# Patient Record
Sex: Female | Born: 1985 | Race: Black or African American | Hispanic: No | Marital: Single
Health system: Midwestern US, Community
[De-identification: ages and names within clinical notes are randomized; demographics above are authoritative.]

---

## 2011-08-26 IMAGING — CR RIGHT ANKLE - COMPLETE 3+ VIEW
1 series · 5 of 5 positions shown · non-contrast
Comparison: none

REASON FOR EXAM: painful rt ankle
COMMENTS:

PROCEDURE:     DXR - DXR ANKLE RIGHT COMPLETE  - February 03, 2010 [DATE]
RESULT:     Images of the right ankle demonstrate changes of previous ORIF
with lateral metallic plate along the distal fibula with numerous screws
through the plate into the fibula. No acute bony abnormality is evident.

[Series 1: view not recorded · 0.17mm/px · 5 of 5 slices shown]
[im 1/5]
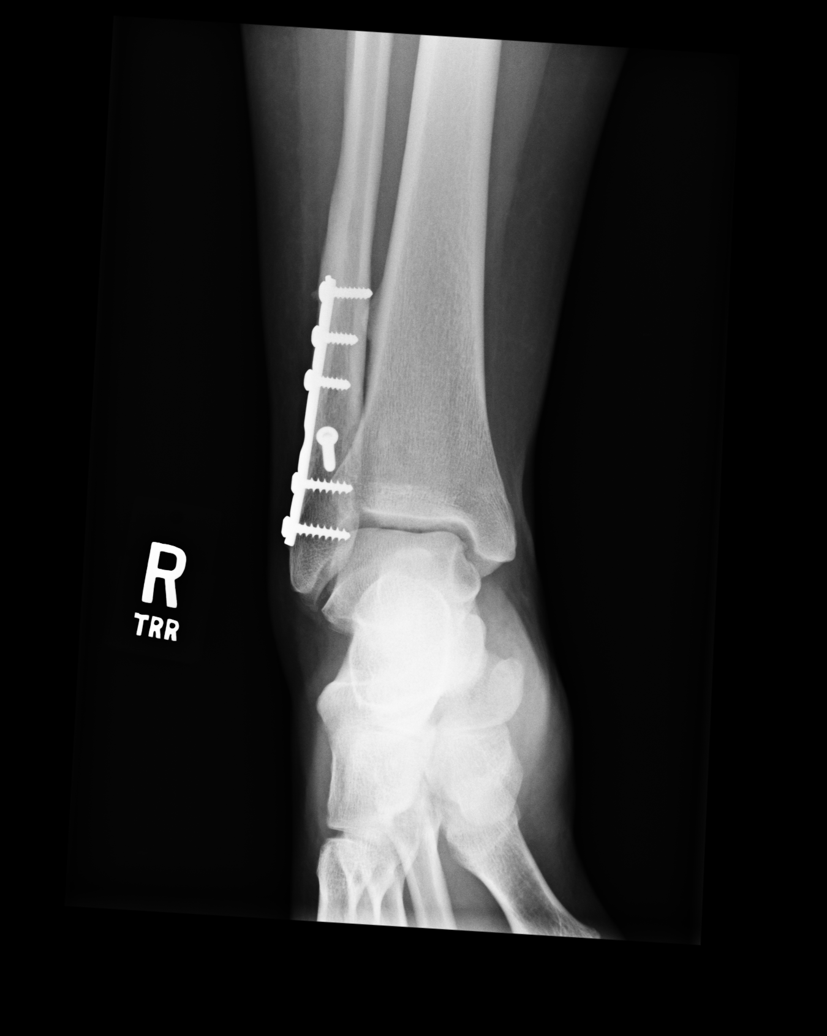
[im 2/5]
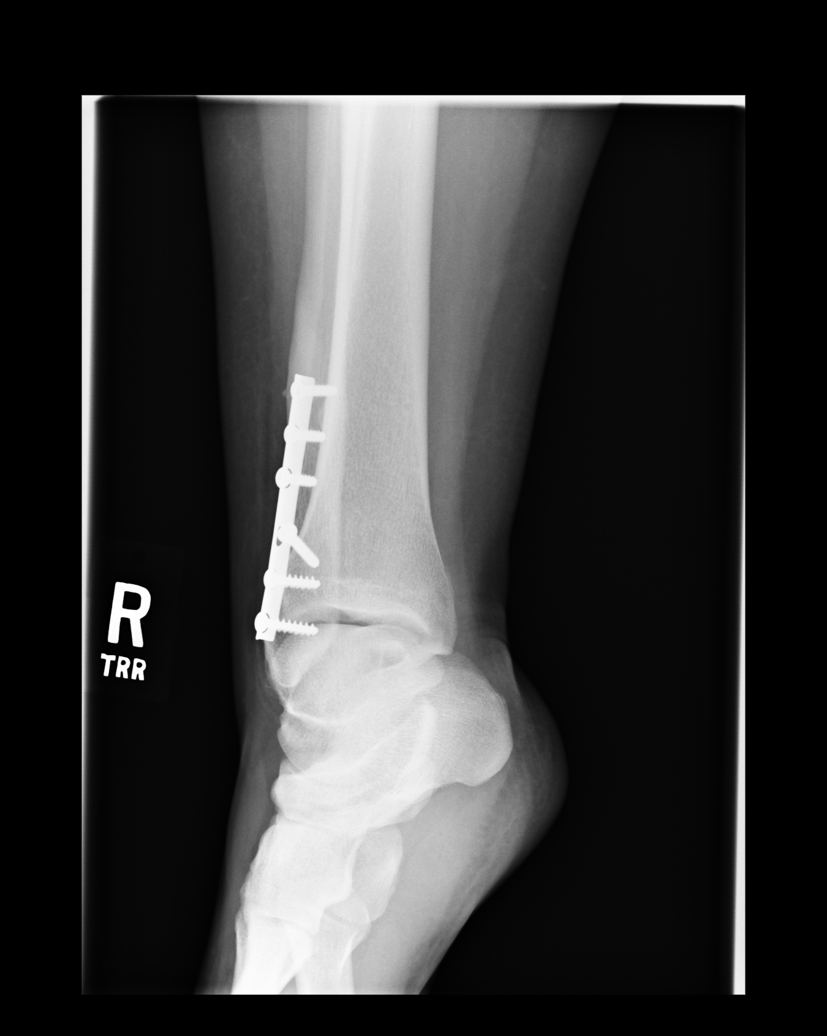
[im 3/5]
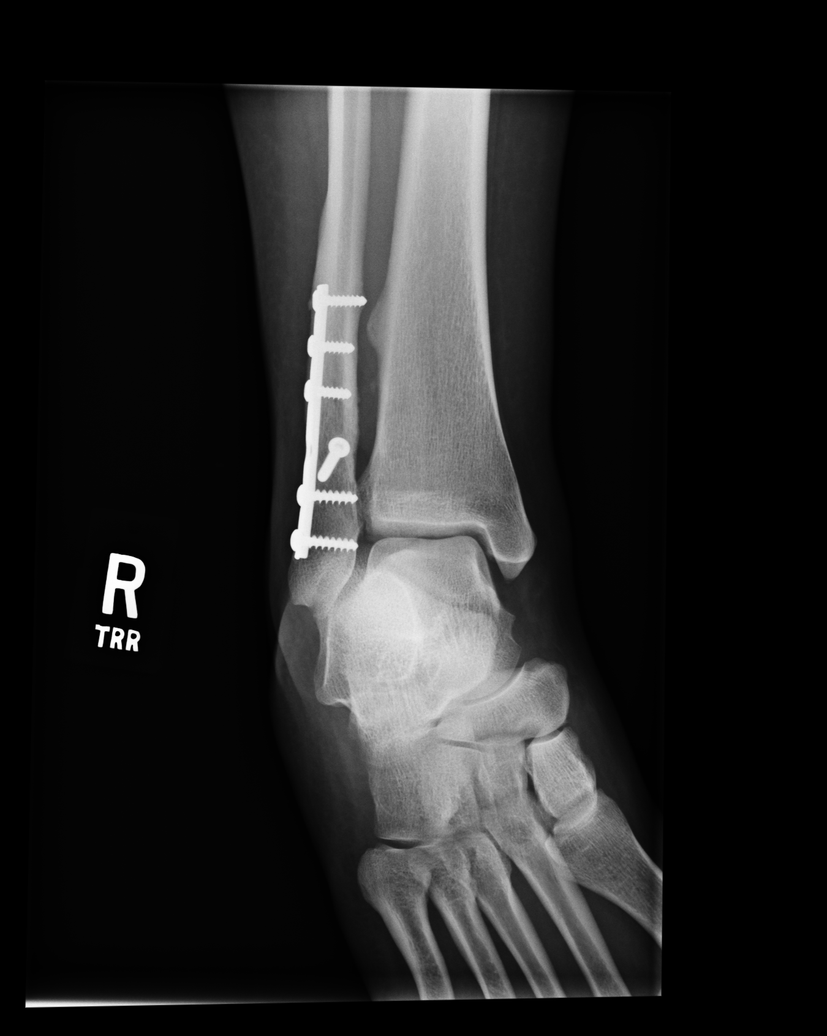
[im 4/5]
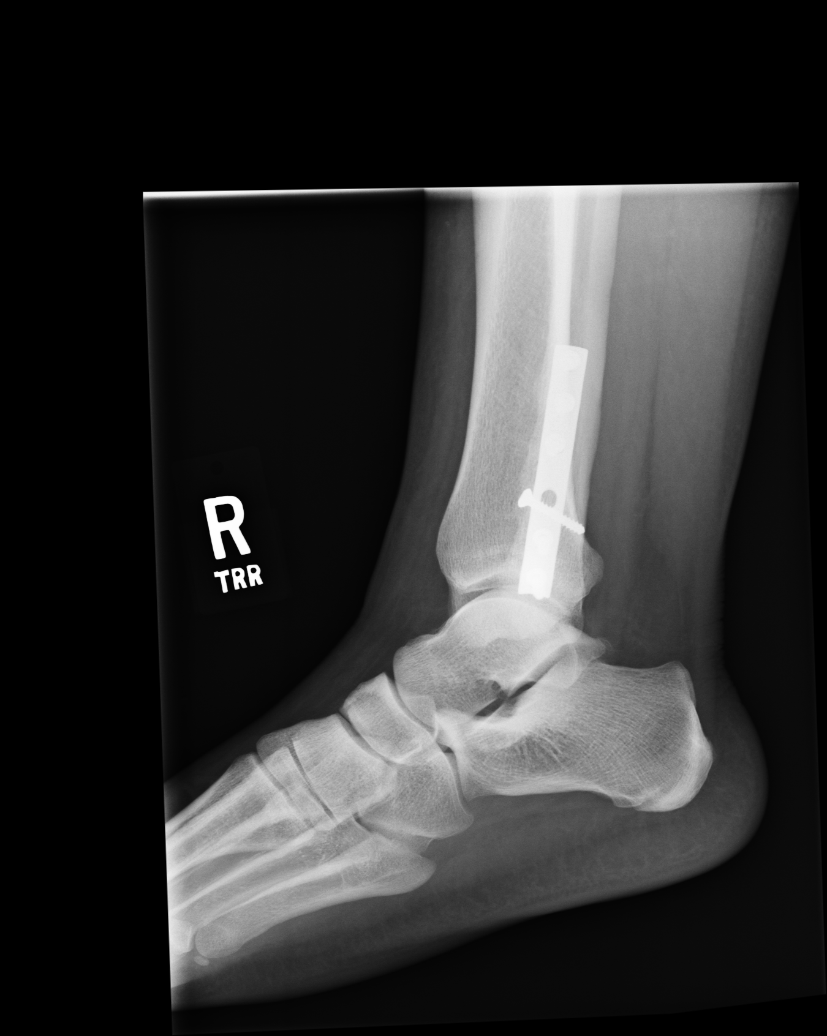
[im 5/5]
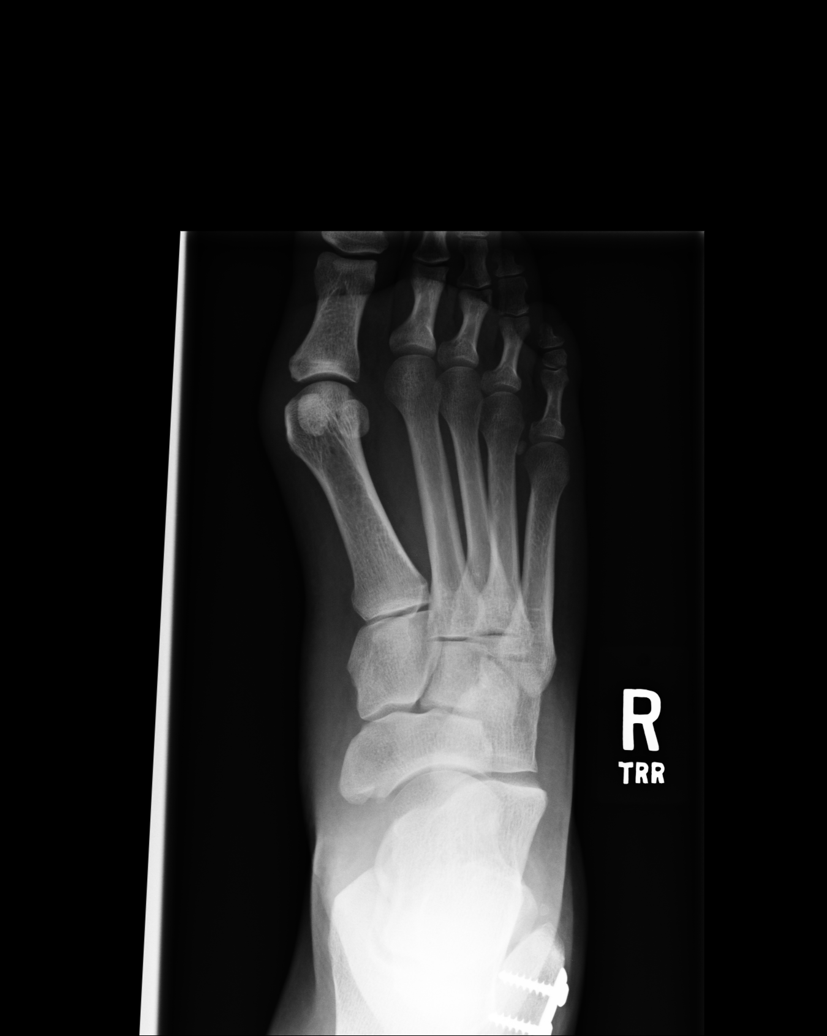

[5 of 5 positions shown; findings below may reference images not displayed]

IMPRESSION: No acute abnormality demonstrated.

## 2015-06-16 ENCOUNTER — Inpatient Hospital Stay
Admit: 2015-06-16 | Discharge: 2015-06-16 | Disposition: A | Discharge status: Home / Self Care | Payer: MEDICARE | Attending: Emergency Medicine

## 2015-06-16 ENCOUNTER — Emergency Department: Admit: 2015-06-16 | Payer: MEDICARE | Primary: Family Medicine

## 2015-06-16 DIAGNOSIS — M5441 Lumbago with sciatica, right side: Secondary | ICD-10-CM

## 2015-06-16 LAB — METABOLIC PANEL, COMPREHENSIVE
A-G Ratio: 1 (ref 1.0–3.1)
ALT (SGPT): 21 U/L (ref 12.0–78.0)
AST (SGOT): 14 U/L — ABNORMAL LOW (ref 15–37)
Albumin: 4 g/dL (ref 3.40–5.00)
Alk. phosphatase: 85 U/L (ref 46–116)
Anion gap: 15 mmol/L (ref 10–17)
BUN/Creatinine ratio: 10 (ref 6.0–20.0)
BUN: 9 MG/DL (ref 7–18)
Bilirubin, total: 0.2 MG/DL (ref 0.20–1.00)
CO2: 27 mmol/L (ref 21–32)
Calcium: 9.7 MG/DL (ref 8.5–10.1)
Chloride: 103 mmol/L (ref 98–107)
Creatinine: 0.87 MG/DL (ref 0.6–1.3)
GFR est AA: >60 mL/min/{1.73_m2} (ref >60)
GFR est non-AA: >60 mL/min/{1.73_m2} (ref >60)
Globulin: 3.9 g/dL
Glucose: 87 mg/dL (ref 74–106)
Potassium: 4.4 mmol/L (ref 3.50–5.10)
Protein, total: 7.9 g/dL (ref 6.40–8.20)
Sodium: 141 mmol/L (ref 136–145)

## 2015-06-16 LAB — URINALYSIS W/ RFLX MICROSCOPIC
Bilirubin: NEGATIVE
Blood: NEGATIVE
Glucose: NEGATIVE mg/dL
Leukocyte Esterase: NEGATIVE
Nitrites: NEGATIVE
Protein: NEGATIVE mg/dL
Specific gravity: 1.025 (ref 1.003–1.030)
Urobilinogen: 0.2 EU/dL (ref 0.2–1.0)
pH (UA): 6.5 (ref 5.0–7.0)

## 2015-06-16 LAB — CBC WITH AUTOMATED DIFF
ABS. BASOPHILS: 0 10*3/uL (ref 0.0–0.2)
ABS. EOSINOPHILS: 0 10*3/uL (ref 0.0–0.7)
ABS. LYMPHOCYTES: 2.5 10*3/uL (ref 1.2–3.4)
ABS. MONOCYTES: 0.3 10*3/uL — ABNORMAL LOW (ref 1.1–3.2)
ABS. NEUTROPHILS: 3 10*3/uL (ref 1.4–6.5)
BASOPHILS: 0 % (ref 0–2)
EOSINOPHILS: 1 % (ref 0–5)
HCT: 41.2 % (ref 34.0–42.2)
HGB: 13.6 g/dL (ref 11.8–14.2)
IMMATURE GRANULOCYTES: 0.2 % (ref 0.0–5.0)
LYMPHOCYTES: 42 % — ABNORMAL HIGH (ref 16–40)
MCH: 29 PG (ref 27–31)
MCHC: 33 g/dL (ref 32–36)
MCV: 87.8 FL (ref 81–99)
MONOCYTES: 6 % (ref 0–12)
MPV: 11.6 FL — ABNORMAL HIGH (ref 7.4–10.4)
NEUTROPHILS: 51 % (ref 40–70)
PLATELET: 314 10*3/uL (ref 140–450)
RBC: 4.69 M/uL (ref 4.0–5.2)
RDW: 13.2 % (ref 11.5–14.5)
WBC: 5.9 10*3/uL (ref 4.8–10.8)

## 2015-06-16 LAB — HCG QL SERUM: HCG, Ql.: NEGATIVE

## 2015-06-16 MED ORDER — LEVETIRACETAM 500 MG TAB
500 mg | ORAL | Status: DC
Start: 2015-06-16 — End: 2015-06-16

## 2015-06-16 MED ORDER — OXYCODONE 5 MG TAB
5 mg | ORAL | Status: AC
Start: 2015-06-16 — End: 2015-06-16
  Administered 2015-06-16: 11:00:00 via ORAL

## 2015-06-16 MED ORDER — LEVETIRACETAM 500 MG TAB
500 mg | Freq: Two times a day (BID) | ORAL | Status: DC
Start: 2015-06-16 — End: 2015-06-16

## 2015-06-16 MED ORDER — LAMOTRIGINE 25 MG TAB
25 mg | Freq: Every day | ORAL | Status: DC
Start: 2015-06-16 — End: 2015-06-16

## 2015-06-16 MED ORDER — LEVETIRACETAM 250 MG TAB
250 mg | ORAL | Status: AC
Start: 2015-06-16 — End: 2015-06-16
  Administered 2015-06-16: 10:00:00 via ORAL

## 2015-06-16 MED ORDER — OXYCODONE 5 MG CAP
5 mg | ORAL_CAPSULE | ORAL | 0 refills | Status: AC | PRN
Start: 2015-06-16 — End: 2015-06-19

## 2015-06-16 MED ORDER — LAMOTRIGINE 25 MG TAB
25 mg | ORAL | Status: DC
Start: 2015-06-16 — End: 2015-06-16

## 2015-06-16 MED ORDER — KETOROLAC TROMETHAMINE 30 MG/ML INJECTION
30 mg/mL (1 mL) | INTRAMUSCULAR | Status: AC
Start: 2015-06-16 — End: 2015-06-16
  Administered 2015-06-16: 10:00:00 via INTRAVENOUS

## 2015-06-16 MED ORDER — IBUPROFEN 400 MG TAB
400 mg | ORAL_TABLET | Freq: Three times a day (TID) | ORAL | 0 refills | Status: AC | PRN
Start: 2015-06-16 — End: 2015-06-21

## 2015-06-16 MED FILL — OXYCODONE 5 MG TAB: 5 mg | ORAL | Qty: 1

## 2015-06-16 MED FILL — LEVETIRACETAM 500 MG TAB: 500 mg | ORAL | Qty: 2

## 2015-06-16 MED FILL — LAMOTRIGINE 25 MG TAB: 25 mg | ORAL | Qty: 1

## 2015-06-16 MED FILL — LEVETIRACETAM 250 MG TAB: 250 mg | ORAL | Qty: 1

## 2015-06-16 MED FILL — KETOROLAC TROMETHAMINE 30 MG/ML INJECTION: 30 mg/mL (1 mL) | INTRAMUSCULAR | Qty: 1

## 2015-06-16 NOTE — ED Triage Notes (Signed)
29 y/o female with reports of slip/trip fall down stairs on Saturday. Reports hitting lower and upper back on last 2 stairs. Denies LOC. Reports c/o back pain since fall with increasing intensity. States today had syncopal episode. Pt states she last remembers startingh down steps. States she then awoke to niece standing over her and yelling her name. Pt states she was incontinent of urine at the time.

## 2015-06-16 NOTE — ED Provider Notes (Signed)
HPI Comments: Pamela Perkins is a 29 y.o. Female with hx of surgery on her spine, non-convulsive seizures, and asthma who presents to the ED with complaint of a syncopal episode PTA. Pt fell down the stairs in her home. She does not remember falling down the steps. She also fell on her back 6 days ago on the icy steps in front of her house. She also complains of worsening back pain and weakness in her legs after falling 6 days ago. She reports that both of her legs tingle "like they are asleep" when she stands on them. She has had tingling and weakness like this before, since her back surgery, but it has "never been this bad before." She was incontinent of urine today when she had a syncopal episode. Pt has a hx of seizures but reports that her episodes are normally similar to "staring off in the distance and falling over." She notes that she gets dizzy when she stands up quickly. Pt is non compliant with her seizure medications x1 week. Pt explains that the back surgery she had was a cyst removed from her back and disc fusion. Her leg pain radiates down her leg and the tingling is exacerbated by weightbearing. There are no alleviating factors.     Patient is a 29 y.o. female presenting with syncope. The history is provided by the patient.   Syncope    This is a new problem. The current episode started 1 to 2 hours ago. The problem has been resolved. She lost consciousness for a period of less than one minute. The problem is associated with normal activity. Associated symptoms include bladder incontinence, back pain and weakness. Pertinent negatives include no chest pain, no palpitations, no fever, no abdominal pain, no nausea, no vomiting and no headaches. She has tried nothing for the symptoms. The treatment provided no relief. Her past medical history is significant for syncope.        Past Medical History:   Diagnosis Date   ??? Asthma    ??? Seizures (HCC)         History reviewed. No pertinent past surgical history.      History reviewed. No pertinent family history.    Social History     Social History   ??? Marital status: SINGLE     Spouse name: N/A   ??? Number of children: N/A   ??? Years of education: N/A     Occupational History   ??? Not on file.     Social History Main Topics   ??? Smoking status: Current Every Day Smoker     Packs/day: 0.25   ??? Smokeless tobacco: Not on file   ??? Alcohol use 1.2 oz/week     2 Glasses of wine per week   ??? Drug use: No   ??? Sexual activity: Not on file     Other Topics Concern   ??? Not on file     Social History Narrative   ??? No narrative on file         ALLERGIES: Dilaudid [hydromorphone (bulk)] and Vicodin [hydrocodone-acetaminophen]    Review of Systems   Constitutional: Negative for activity change, appetite change and fever.   HENT: Negative for ear pain and trouble swallowing.    Eyes: Negative for pain and visual disturbance.   Respiratory: Negative for cough and shortness of breath.    Cardiovascular: Positive for syncope. Negative for chest pain and palpitations.   Gastrointestinal: Negative for abdominal pain, diarrhea, nausea and  vomiting.   Genitourinary: Positive for bladder incontinence. Negative for dysuria, menstrual problem, pelvic pain, vaginal bleeding and vaginal discharge.   Musculoskeletal: Positive for back pain. Negative for neck stiffness.   Skin: Negative for rash.   Neurological: Positive for syncope and weakness. Negative for headaches.   Hematological: Negative for adenopathy. Does not bruise/bleed easily.       Vitals:    06/16/15 0125 06/16/15 0130 06/16/15 0230 06/16/15 0507   BP:   125/71 (!) 120/95   Pulse:   77 78   Resp:   20 20   Temp: 98.2 ??F (36.8 ??C)   98.2 ??F (36.8 ??C)   SpO2:  98% 100% 100%   Weight:       Height:                Physical Exam   Constitutional: She is oriented to person, place, and time. She appears well-developed and well-nourished. She does not appear ill.   HENT:    Head: Normocephalic.   Right Ear: Tympanic membrane normal.   Left Ear: Tympanic membrane normal.   Nose: Nose normal.   Mouth/Throat: Uvula is midline.   Eyes: Conjunctivae and EOM are normal. Pupils are equal, round, and reactive to light.   Neck: Trachea normal and normal range of motion.   Cardiovascular: Normal rate, regular rhythm and normal pulses.    Pulmonary/Chest: Effort normal and breath sounds normal.   Abdominal: Soft. Normal appearance and bowel sounds are normal.   Musculoskeletal: Normal range of motion.   Neurological: She is alert and oriented to person, place, and time. She has normal strength. She displays a negative Romberg sign. GCS eye subscore is 4. GCS verbal subscore is 5. GCS motor subscore is 6.   Romberg negative.  Decreased sensation on right side.  Straight leg is positive on the right.   Skin: Skin is warm, dry and intact.        Psychiatric: She has a normal mood and affect. Her speech is normal.   Nursing note and vitals reviewed.       MDM  Number of Diagnoses or Management Options  Acute right-sided low back pain with right-sided sciatica:   Fall, initial encounter:   History of seizure:   Diagnosis management comments: 29 y.o. female with Hx of seizures of asthma, seizures, and lower back now complains of R lower back pin radiating down her legs as well as concern for syncopal episode. Differential diagnosis includes seizure vs mechanical fall with head trauma vs vasovagal syncope.       Plan:   Draw blood work  CT head  Medicate for seizures   Follow up with neurologist.   XR lumbar spine  Pain medications  UA  Follow up with PCP, neurologist, and the surgeon that did her past back surgeries.    -----  1:35 AM  Documented by Milana Huntsman, acting as a scribe for Dr. Armida Sans, MD    PROVIDER ATTESTATION:  7:25 AM    The entirety of this note, signed by me, accurately reflects all works, treatments, procedures, and medical decision making performed by me,  Armida Sans, MD.            Patient Progress  Patient progress: stable    ED Course     Diagnostic Studies:  XRAY 1:59 AM     Left  Right     Fingers   Hand   Wrist   Forearm   Elbow  Humerus   Shoulder     Toes   Foot   Ankle   Tib/fib   Knee   Femur   Hip   Pelvis    Cervical Spine   Thoracic Spine   Lumbar Spine   Sacrum   Ribs         Viewed by me     Interpreted by me   No fracture/dislocation   Abnormal : see below    Discussed with       Radiologist        Interpretation: No hardware seen, no obvious fracture    CT Scan 4:04 AM   Head  C-spine  Chest    Abd/Pelvis    Other:    Viewed by me    Interpreted by me   No acute disease   Abnormal: see below   Discussed with Radiologist     Interpretation: negative for ICH, infarct, or mass. Osseous structures intact.        Lab Data:  Labs Reviewed   METABOLIC PANEL, COMPREHENSIVE - Abnormal; Notable for the following:        Result Value    AST 14 (*)     All other components within normal limits   CBC WITH AUTOMATED DIFF - Abnormal; Notable for the following:     MPV 11.6 (*)     LYMPHOCYTES 42 (*)     ABS. MONOCYTES 0.3 (*)     All other components within normal limits   URINALYSIS W/ RFLX MICROSCOPIC - Abnormal; Notable for the following:     Ketone TRACE (*)     All other components within normal limits   HCG QL SERUM         ED Course:     4:04 AM   Pending urinalysis. Patient awake aware. Not in acute distress.     Patient UA reviewed. No acute findings. Patient reassured. Was given oral dose of keppra for seizure history. Discussed return to primary care for follow up.       Procedures

## 2015-06-16 NOTE — ED Notes (Signed)
I have reviewed discharge instructions with the patient.  The patient verbalized understanding.

## 2016-03-19 DIAGNOSIS — T8131XA Disruption of external operation (surgical) wound, not elsewhere classified, initial encounter: Secondary | ICD-10-CM

## 2016-03-19 NOTE — ED Provider Notes (Signed)
HPI Comments: Pamela Perkins is a 30 y.o.obese female who presents to the ED with complaint of oozing from surgical wound for the past 10 days. Pt had a laparoscopic GYN procedure, and noted oozing from her wound a few days after. She attempted to call her surgeon unsuccessfully, and then went to an urgent care that provided her with steri strips. Pt was stretching and moving around today when she heard a ripping sound, and noted the dehiscence to be larger, have sudden pain below her umbilicus around the wound, and noted more discharge described as serosanguinous. Pain improves with holding her abdomen in place and staying still, and worsens with moving around. No abdominal pain otherwise, no bleeding from other sites and no other symptoms.       Patient is a 30 y.o. female presenting with wound dehiscence. The history is provided by the patient and the spouse.   Wound Dehiscence    This is a new problem. Episode onset: today. The problem has been gradually improving. Associated with: stretching. The pain is located in the periumbilical region. The patient is experiencing no pain. Pertinent negatives include no fever, no diarrhea, no hematochezia, no melena, no nausea, no vomiting, no constipation, no dysuria, no frequency, no headaches, no chest pain and no back pain. The pain is worsened by certain positions (movement). The pain is relieved by recumbency. laparascopic surgery on9/13/17       Past Medical History:   Diagnosis Date   ??? Asthma    ??? Seizures (HCC)        History reviewed. No pertinent surgical history.      History reviewed. No pertinent family history.    Social History     Social History   ??? Marital status: SINGLE     Spouse name: N/A   ??? Number of children: N/A   ??? Years of education: N/A     Occupational History   ??? Not on file.     Social History Main Topics   ??? Smoking status: Former Smoker     Packs/day: 0.25   ??? Smokeless tobacco: Never Used   ??? Alcohol use 1.2 oz/week      2 Glasses of wine per week      Comment: Occaisional   ??? Drug use: No   ??? Sexual activity: Not on file     Other Topics Concern   ??? Not on file     Social History Narrative         ALLERGIES: Dilaudid [hydromorphone (bulk)] and Vicodin [hydrocodone-acetaminophen]    Review of Systems   Constitutional: Negative.  Negative for diaphoresis and fever.   HENT: Negative.  Negative for rhinorrhea and sore throat.    Respiratory: Negative.  Negative for cough, chest tightness and shortness of breath.    Cardiovascular: Negative.  Negative for chest pain and leg swelling.   Gastrointestinal: Negative.  Negative for abdominal pain, constipation, diarrhea, hematochezia, melena, nausea and vomiting.   Genitourinary: Negative.  Negative for difficulty urinating, dysuria and frequency.   Musculoskeletal: Negative.  Negative for back pain and joint swelling.   Skin: Positive for wound. Negative for color change and rash.   Allergic/Immunologic: Negative.  Negative for food allergies and immunocompromised state.   Neurological: Negative.  Negative for weakness and headaches.   All other systems reviewed and are negative.      Vitals:    03/19/16 2203   Pulse: 95   Resp: 20   Temp: 98.2 ??F (36.8 ??  C)   SpO2: 98%   Weight: 108.9 kg (240 lb)   Height: 5\' 6"  (1.676 m)            Physical Exam   Constitutional: She is oriented to person, place, and time. She appears well-developed and well-nourished. No distress.   Well-appearing   HENT:   Head: Normocephalic and atraumatic.   Mouth/Throat: Oropharynx is clear and moist.   Eyes: Conjunctivae are normal. Right eye exhibits no discharge. Left eye exhibits no discharge.   Neck: Normal range of motion. Neck supple.   Cardiovascular: Normal rate, regular rhythm and normal heart sounds.  Exam reveals no gallop and no friction rub.    No murmur heard.  Pulmonary/Chest: Effort normal and breath sounds normal. No respiratory distress. She has no wheezes. She has no rales. She exhibits no  tenderness.   Abdominal: Soft. She exhibits no distension and no mass. There is no tenderness. There is no rebound and no guarding.       Pendulous abdomen  4 cm wound below umbilicus, gaping with intact fascia. Subcuticular sutures are partially present in lower aspect, with minimal approximation. Minimal serosanguinous discharge. No active bleeding. No surrounding skin changes.    Musculoskeletal: Normal range of motion. She exhibits no edema or tenderness.   No calf tenderness   Neurological: She is alert and oriented to person, place, and time.   Moving all extremities with no focality   Skin: Skin is warm and dry. No rash noted. She is not diaphoretic.   Nursing note and vitals reviewed.       MDM  Number of Diagnoses or Management Options  Wound dehiscence:   Diagnosis management comments: 30 y/o obese female with wound dehiscence, with intact fascia. No signs of infection. Discussed with the patient risks of closure (namely infection) vs secondary healing. Pt verbalized understanding. Xeroform used to dress the wound. Pt advised to wear binder or high waist pants to support abdominal wall. Advised to f/u with surgeon and educated on signs of infection and reasons to return to the ER. Pt DCed.    Patient Progress  Patient progress: stable    ED Course       Procedures

## 2016-03-19 NOTE — ED Triage Notes (Signed)
30 y/o female to Ed with c/o wound dehisence. Pt reports abdominal surgery s/p March 07, 2016. States wound closure with steri strips. States wound with bleeding since surgery. Seen at urgent care and given additional steri strips. States today heard "ripping sound" and noted blood coming from wound.

## 2016-03-20 ENCOUNTER — Inpatient Hospital Stay
Admit: 2016-03-20 | Discharge: 2016-03-20 | Disposition: A | Discharge status: Home / Self Care | Payer: PRIVATE HEALTH INSURANCE | Attending: Emergency Medicine

## 2016-03-20 NOTE — ED Notes (Signed)
Pt not found in room. Doc aware. Doc states that she gave the pt detailed verbal d/c instructions..Marland Kitchen

## 2016-03-20 NOTE — ED Notes (Signed)
Pt left without written d/c instructions.

## 2016-08-20 DIAGNOSIS — N808 Other endometriosis: Secondary | ICD-10-CM

## 2016-08-20 NOTE — ED Provider Notes (Signed)
HPI Comments: Pamela Perkins is a 31 y.o. female with a hx of endometriosis with laparoscopic surgery in Sept 2017 who presents to the ED with complaint of lower abdominal pain x 3 days with associated nausea and non bloody vomiting.  Pain is intermittent, moderate, and non radiating. No modifying or exacerbating  factors for her sx at this time. No fever, chills, cp, sob, diarrhea, dysuria, hematuria, weakness or numbness. She contacted her regular doctor who instructed her to be seen within ED.    Patient is a 31 y.o. female presenting with abdominal pain. The history is provided by the patient.   Abdominal Pain    This is a recurrent problem. The current episode started more than 2 days ago (3 days). The problem has not changed since onset.Associated symptoms include nausea and vomiting. Pertinent negatives include no fever, no diarrhea, no dysuria, no frequency, no headaches, no arthralgias, no myalgias and no chest pain. Past workup includes surgery (laproscopic surgery).        Past Medical History:   Diagnosis Date   ??? Asthma    ??? Ill-defined condition     endometriosis   ??? Seizures (HCC)        History reviewed. No pertinent surgical history.      History reviewed. No pertinent family history.    Social History     Social History   ??? Marital status: SINGLE     Spouse name: N/A   ??? Number of children: N/A   ??? Years of education: N/A     Occupational History   ??? Not on file.     Social History Main Topics   ??? Smoking status: Former Smoker     Packs/day: 0.25   ??? Smokeless tobacco: Never Used   ??? Alcohol use 1.2 oz/week     2 Glasses of wine per week      Comment: Occaisional   ??? Drug use: No   ??? Sexual activity: Not on file     Other Topics Concern   ??? Not on file     Social History Narrative         ALLERGIES: Dilaudid [hydromorphone (bulk)]; Hydrocodone; Ibuprofen; and Vicodin [hydrocodone-acetaminophen]    Review of Systems   Constitutional: Negative.  Negative for chills and fever.    HENT: Negative.  Negative for congestion and sore throat.    Respiratory: Negative.  Negative for cough and shortness of breath.    Cardiovascular: Negative.  Negative for chest pain and palpitations.   Gastrointestinal: Positive for abdominal pain, nausea and vomiting. Negative for diarrhea.   Genitourinary: Negative.  Negative for dysuria and frequency.   Musculoskeletal: Negative.  Negative for arthralgias and myalgias.   Skin: Negative.  Negative for pallor and rash.   Allergic/Immunologic: Negative.  Negative for immunocompromised state.   Neurological: Negative.  Negative for weakness, light-headedness and headaches.   All other systems reviewed and are negative.      Vitals:    08/20/16 2040   BP: 138/83   Pulse: 77   Resp: 18   Temp: 98 ??F (36.7 ??C)   SpO2: 99%   Weight: 108.9 kg (240 lb)   Height: 5\' 6"  (1.676 Pamela Perkins)            Physical Exam   Constitutional: She is oriented to person, place, and time. She appears well-developed and well-nourished.   HENT:   Head: Normocephalic and atraumatic.   Eyes: Conjunctivae and EOM are normal. Pupils are equal,  round, and reactive to light. Right eye exhibits no discharge. Left eye exhibits no discharge. No scleral icterus.   Neck: Normal range of motion. Neck supple.   Cardiovascular: Normal rate, regular rhythm and normal heart sounds.  Exam reveals no gallop and no friction rub.    No murmur heard.  Pulmonary/Chest: Effort normal and breath sounds normal. No respiratory distress. She has no wheezes. She has no rales.   Abdominal: Soft. Bowel sounds are normal. She exhibits no distension. There is tenderness (diffuse). There is no rebound and no guarding.   Musculoskeletal: Normal range of motion. She exhibits no edema.   Neurological: She is alert and oriented to person, place, and time.   Skin: Skin is warm and dry. No rash noted. No erythema.   Psychiatric: She has a normal mood and affect. Her behavior is normal. Judgment and thought content normal.    Nursing note and vitals reviewed.       MDM  Number of Diagnoses or Management Options  Diagnosis management comments: 31 y.o. female with hx of endometriosis with multiple scrapings of her abdomen over the past several years presents to ED with abdominal pain secondary to her endometriosis. She contacted her regular doctor who instructed her to come to the ED.      Plan:     Abdominal lab profile  Zofran  Benadryl  Haldol  Ativan  -----  9:20 PM  Documented by Harriett Rush, acting as a scribe for Dr. Juliann Mule, MD      PROVIDER ATTESTATION:  11:22 PM    The entirety of this note, signed by me, accurately reflects all works, treatments, procedures, and medical decision making performed by me, Juliann Mule, MD.            Patient Progress  Patient progress: stable        ED Course   Diagnostic Studies:      Lab Data:  Labs Reviewed   CBC WITH AUTOMATED DIFF - Abnormal; Notable for the following:        Result Value    MPV 11.0 (*)     LYMPHOCYTES 48 (*)     ABS. MONOCYTES 0.3 (*)     All other components within normal limits   URINALYSIS W/ RFLX MICROSCOPIC - Abnormal; Notable for the following:     Blood TRACE (*)     All other components within normal limits   METABOLIC PANEL, COMPREHENSIVE   LIPASE   URINE MICROSCOPIC         ED Course:     11:23 PM  Patient feeling better to DC    Procedures

## 2016-08-20 NOTE — ED Notes (Signed)
I have reviewed discharge instructions with the patient.  The patient verbalized understanding.

## 2016-08-20 NOTE — ED Triage Notes (Signed)
Pamela Perkins is a 31 y.o. female states that she has history of endometriosis. Pt states that pain has been present times 3 days. Pt states that she noted to have bleeding. Pt states that she does not have a menstral cycle. Pt states she was bleeding through clothing. Pt states that it started then stopped.

## 2016-08-21 ENCOUNTER — Inpatient Hospital Stay
Admit: 2016-08-21 | Discharge: 2016-08-21 | Disposition: A | Discharge status: Home / Self Care | Payer: PRIVATE HEALTH INSURANCE | Attending: Specialist

## 2016-08-21 LAB — URINE MICROSCOPIC
Bacteria: NEGATIVE /hpf
Casts: NONE SEEN /lpf
Crystals, urine: NONE SEEN /LPF
WBC: NEGATIVE /hpf

## 2016-08-21 LAB — CBC WITH AUTOMATED DIFF
ABS. BASOPHILS: 0 10*3/uL (ref 0.0–0.2)
ABS. EOSINOPHILS: 0.1 10*3/uL (ref 0.0–0.7)
ABS. LYMPHOCYTES: 2.5 10*3/uL (ref 1.2–3.4)
ABS. MONOCYTES: 0.3 10*3/uL — ABNORMAL LOW (ref 1.1–3.2)
ABS. NEUTROPHILS: 2.4 10*3/uL (ref 1.4–6.5)
BASOPHILS: 0 % (ref 0–2)
EOSINOPHILS: 1 % (ref 0–5)
HCT: 39.2 % (ref 34.0–42.2)
HGB: 12.7 g/dL (ref 11.8–14.2)
IMMATURE GRANULOCYTES: 0 % (ref 0.0–5.0)
LYMPHOCYTES: 48 % — ABNORMAL HIGH (ref 16–40)
MCH: 27.4 PG (ref 27–31)
MCHC: 32.4 g/dL (ref 32–36)
MCV: 84.7 FL (ref 81–99)
MONOCYTES: 5 % (ref 0–12)
MPV: 11 FL — ABNORMAL HIGH (ref 7.4–10.4)
NEUTROPHILS: 46 % (ref 40–70)
PLATELET: 322 10*3/uL (ref 140–450)
RBC: 4.63 M/uL (ref 4.0–5.2)
RDW: 13.5 % (ref 11.5–14.5)
WBC: 5.3 10*3/uL (ref 4.8–10.8)

## 2016-08-21 LAB — METABOLIC PANEL, COMPREHENSIVE
A-G Ratio: 1 (ref 1.0–3.1)
ALT (SGPT): 23 U/L (ref 12.0–78.0)
AST (SGOT): 19 U/L (ref 15–37)
Albumin: 3.7 g/dL (ref 3.40–5.00)
Alk. phosphatase: 68 U/L (ref 46–116)
Anion gap: 15 mmol/L (ref 10–17)
BUN/Creatinine ratio: 15 (ref 6.0–20.0)
BUN: 10 MG/DL (ref 7–18)
Bilirubin, total: 0.3 MG/DL (ref 0.20–1.00)
CO2: 26 mmol/L (ref 21–32)
Calcium: 9.8 MG/DL (ref 8.5–10.1)
Chloride: 104 mmol/L (ref 98–107)
Creatinine: 0.67 MG/DL (ref 0.6–1.3)
GFR est AA: >60 mL/min/{1.73_m2} (ref >60)
GFR est non-AA: >60 mL/min/{1.73_m2} (ref >60)
Globulin: 3.7 g/dL
Glucose: 90 mg/dL (ref 74–106)
Potassium: 4.6 mmol/L (ref 3.50–5.10)
Protein, total: 7.4 g/dL (ref 6.40–8.20)
Sodium: 140 mmol/L (ref 136–145)

## 2016-08-21 LAB — URINALYSIS W/ RFLX MICROSCOPIC
Bilirubin: NEGATIVE
Glucose: NEGATIVE mg/dL
Ketone: NEGATIVE mg/dL
Leukocyte Esterase: NEGATIVE
Nitrites: NEGATIVE
Protein: NEGATIVE mg/dL
Specific gravity: 1.015 (ref 1.003–1.030)
Urobilinogen: 0.2 EU/dL (ref 0.2–1.0)
pH (UA): 6.5 (ref 5.0–7.0)

## 2016-08-21 LAB — LIPASE: Lipase: 70 U/L (ref 65–230)

## 2016-08-21 MED ORDER — LORAZEPAM 2 MG/ML IJ SOLN
2 mg/mL | INTRAMUSCULAR | Status: AC
Start: 2016-08-21 — End: 2016-08-20
  Administered 2016-08-21: 03:00:00 via INTRAVENOUS

## 2016-08-21 MED ORDER — HALOPERIDOL LACTATE 5 MG/ML IJ SOLN
5 mg/mL | INTRAMUSCULAR | Status: AC
Start: 2016-08-21 — End: 2016-08-20
  Administered 2016-08-21: 03:00:00 via INTRAMUSCULAR

## 2016-08-21 MED ORDER — SODIUM CHLORIDE 0.9 % IJ SYRG
INTRAMUSCULAR | Status: DC | PRN
Start: 2016-08-21 — End: 2016-08-21
  Administered 2016-08-21: 03:00:00 via INTRAVENOUS

## 2016-08-21 MED ORDER — SODIUM CHLORIDE 0.9% BOLUS IV
0.9 % | INTRAVENOUS | Status: AC
Start: 2016-08-21 — End: 2016-08-20
  Administered 2016-08-21: 03:00:00 via INTRAVENOUS

## 2016-08-21 MED ORDER — DIPHENHYDRAMINE HCL 50 MG/ML IJ SOLN
50 mg/mL | INTRAMUSCULAR | Status: AC
Start: 2016-08-21 — End: 2016-08-20
  Administered 2016-08-21: 03:00:00 via INTRAVENOUS

## 2016-08-21 MED FILL — SODIUM CHLORIDE 0.9 % IV: INTRAVENOUS | Qty: 1000

## 2016-08-21 MED FILL — LORAZEPAM 2 MG/ML IJ SOLN: 2 mg/mL | INTRAMUSCULAR | Qty: 1

## 2016-08-21 MED FILL — HALOPERIDOL LACTATE 5 MG/ML IJ SOLN: 5 mg/mL | INTRAMUSCULAR | Qty: 1

## 2016-08-21 MED FILL — DIPHENHYDRAMINE HCL 50 MG/ML IJ SOLN: 50 mg/mL | INTRAMUSCULAR | Qty: 1

## 2017-01-01 DIAGNOSIS — K0889 Other specified disorders of teeth and supporting structures: Secondary | ICD-10-CM

## 2017-01-01 NOTE — ED Notes (Signed)
Went in to scan the medication but the pt was not found in the room. Attending made aware.

## 2017-01-01 NOTE — ED Triage Notes (Signed)
C/o right upper tooth pain x 4 days, has been trying to hold off but pain is too bad now.

## 2017-01-01 NOTE — ED Provider Notes (Signed)
HPI Comments: Pamela Perkins is a 31 y.o. female who presents to the ED with complaint of right upper dental pain x 1 week that has gradually worsened over the past 4 days. Associated sx include intermittent, throbbing headache. Pain is constant, aching, non radiating, and unrelieved after Tylenol and ice, heat, and salt water. She has been unable to afford the dental extraction procedure of her right upper wisdom tooth. No fever, chills, drainage, facial swelling, trouble swallowing, voice change or vomiting.       Patient is a 31 y.o. female presenting with dental problem. The history is provided by the patient.   Dental Pain    This is a new problem. The current episode started more than 2 days ago (1 week). The problem has not changed since onset.The pain is located in the right upper mouth.The quality of the pain is constant and aching.  Associated symptoms include headaches. She has tried acetaminophen for the symptoms.        Past Medical History:   Diagnosis Date   ??? Asthma    ??? Ill-defined condition     endometriosis   ??? Seizures (HCC)        History reviewed. No pertinent surgical history.      History reviewed. No pertinent family history.    Social History     Social History   ??? Marital status: SINGLE     Spouse name: N/A   ??? Number of children: N/A   ??? Years of education: N/A     Occupational History   ??? Not on file.     Social History Main Topics   ??? Smoking status: Former Smoker     Packs/day: 0.25   ??? Smokeless tobacco: Never Used   ??? Alcohol use No      Comment: Occaisional   ??? Drug use: No   ??? Sexual activity: Not on file     Other Topics Concern   ??? Not on file     Social History Narrative         ALLERGIES: Dilaudid [hydromorphone (bulk)]; Hydrocodone; Ibuprofen; and Vicodin [hydrocodone-acetaminophen]    Review of Systems   Constitutional: Negative for chills and fever.   HENT: Positive for dental problem. Negative for congestion, rhinorrhea and sore throat.     Respiratory: Negative for chest tightness and shortness of breath.    Cardiovascular: Negative for chest pain and palpitations.   Gastrointestinal: Negative for diarrhea, nausea and vomiting.   Genitourinary: Negative for difficulty urinating.   Neurological: Negative.  Negative for headaches.       Vitals:    01/01/17 2110   BP: 131/69   Pulse: 71   Resp: 16   Temp: 98.4 ??F (36.9 ??C)   SpO2: 100%   Weight: 108.9 kg (240 lb)   Height: 5\' 6"  (1.676 m)            Physical Exam   Constitutional: She is oriented to person, place, and time. She appears well-developed and well-nourished. No distress.   HENT:   Head: Normocephalic and atraumatic.   Mouth/Throat:       Eyes: Conjunctivae are normal. Pupils are equal, round, and reactive to light. Right eye exhibits no discharge. Left eye exhibits no discharge. No scleral icterus.   Neck: Neck supple.   Cardiovascular: Normal rate, regular rhythm, normal heart sounds and intact distal pulses.  Exam reveals no gallop and no friction rub.    No murmur heard.  Pulmonary/Chest: Effort normal  and breath sounds normal. No respiratory distress. She has no wheezes. She has no rales. She exhibits no tenderness.   Abdominal: Soft. She exhibits no distension. There is no tenderness.   Neurological: She is alert and oriented to person, place, and time.   Skin: Skin is warm and dry. She is not diaphoretic.   Psychiatric: She has a normal mood and affect. Her behavior is normal.   Nursing note and vitals reviewed.       MDM  Number of Diagnoses or Management Options  Dental caries:   Diagnosis management comments: 31 y.o. female with right upper dental pain secondary to dental carie.       Plan:     Infraorbital nerve block with bupivacaine  -----  9:37 PM  Documented by Harriett RushAlexandria Hopkins V, acting as a scribe for Dr. Koleen Distanceeginald M Mlissa Tamayo, MD      PROVIDER ATTESTATION:  8:06 AM    The entirety of this note, signed by me, accurately reflects all works,  treatments, procedures, and medical decision making performed by me, Koleen Distanceeginald M Gery Sabedra, MD.            Patient Progress  Patient progress: stable        ED Course   Diagnostic Studies:      Lab Data:  Labs Reviewed - No data to display      ED Course:   10:13 PM  Patient eloped prior to dental block procedure      Procedures

## 2017-01-02 ENCOUNTER — Inpatient Hospital Stay: Admit: 2017-01-02 | Discharge: 2017-01-02 | Payer: PRIVATE HEALTH INSURANCE | Attending: Pediatric Emergency Medicine

## 2017-01-02 MED ORDER — BUPIVACAINE (PF) 0.5 % (5 MG/ML) IJ SOLN
0.5 % (5 mg/mL) | INTRAMUSCULAR | Status: DC
Start: 2017-01-02 — End: 2017-01-02

## 2017-01-02 MED FILL — BUPIVACAINE (PF) 0.5 % (5 MG/ML) IJ SOLN: 0.5 % (5 mg/mL) | INTRAMUSCULAR | Qty: 30
